# Patient Record
Sex: Male | Born: 1995
Health system: Southern US, Community
[De-identification: ages and names within clinical notes are randomized; demographics above are authoritative.]

---
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
## 2020-02-19 ENCOUNTER — Ambulatory Visit: Admit: 2020-02-19 | Disposition: A | Discharge status: Home / Self Care | Payer: Self-pay

## 2020-02-19 ENCOUNTER — Ambulatory Visit (INDEPENDENT_AMBULATORY_CARE_PROVIDER_SITE_OTHER): Payer: Self-pay

## 2020-02-19 ENCOUNTER — Encounter: Payer: Self-pay | Admitting: Emergency Medicine

## 2020-02-19 ENCOUNTER — Ambulatory Visit
Admission: EM | Admit: 2020-02-19 | Discharge: 2020-02-19 | Disposition: A | Discharge status: Home / Self Care | Payer: Self-pay | Attending: Family Medicine | Admitting: Family Medicine

## 2020-02-19 ENCOUNTER — Other Ambulatory Visit: Payer: Self-pay

## 2020-02-19 DIAGNOSIS — M79644 Pain in right finger(s): Secondary | ICD-10-CM

## 2020-02-19 DIAGNOSIS — S6991XA Unspecified injury of right wrist, hand and finger(s), initial encounter: Secondary | ICD-10-CM

## 2020-02-19 DIAGNOSIS — M7989 Other specified soft tissue disorders: Secondary | ICD-10-CM

## 2020-02-19 MED ORDER — MELOXICAM 15 MG PO TABS: 15.0000 mg | ORAL_TABLET | Freq: Every day | ORAL | 0 refills | Status: DC | PRN

## 2020-02-19 NOTE — ED Triage Notes (Signed)
Patient c/o right middle and ring finger injury while playing basketball 2 weeks ago. He states the pain has continued and his fingers are swollen.

## 2020-02-19 NOTE — ED Provider Notes (Signed)
[facility]    CSN: [id number] Arrival date & time: 02/19/20  1750      History   Chief Complaint Chief Complaint  Patient presents with  . Finger Injury   HPI  24 year old male presents with the above complaint.  Patient reports that he was playing basketball approximately 1.5 weeks ago.  He states that he was catching a pass and jammed his right middle and ring finger.  Patient reports that he continues to have pain despite using a brace.  No medications or other interventions tried.  Patient is concerned given his persistent symptoms and lack of complete resolution.  No other complaints at this time.  Home Medications    Prior to Admission medications   Medication Sig Start Date End Date Taking? Authorizing Provider  meloxicam (MOBIC) 15 MG tablet Take 1 tablet (15 mg total) by mouth daily as needed for pain. 02/19/20   Tommie Sams, DO   Social History Social History   Tobacco Use  . Smoking status: Never Smoker  . Smokeless tobacco: Never Used  Vaping Use  . Vaping Use: Every day  Substance Use Topics  . Alcohol use: Not Currently  . Drug use: Yes    Types: Marijuana     Allergies   Patient has no known allergies.   Review of Systems Review of Systems  Constitutional: Negative.   Musculoskeletal:       Right middle and ring finger pain.   Physical Exam Triage Vital Signs ED Triage Vitals  Enc Vitals Group     BP 02/19/20 1833 (!) 142/91     Pulse Rate 02/19/20 1833 66     Resp 02/19/20 1833 18     Temp 02/19/20 1833 98.1 F (36.7 C)     Temp Source 02/19/20 1833 Oral     SpO2 02/19/20 1833 100 %     Weight 02/19/20 1831 205 lb (93 kg)     Height 02/19/20 1831 5\' 7"  (1.702 m)     Head Circumference --      Peak Flow --      Pain Score 02/19/20 1831 7     Pain Loc --      Pain Edu? --      Excl. in GC? --    Updated Vital Signs BP (!) 142/91 (BP Location: Right Arm)   Pulse 66   Temp 98.1 F (36.7 C) (Oral)   Resp 18   Ht 5'  7" (1.702 m)   Wt 93 kg   SpO2 100%   BMI 32.11 kg/m   Visual Acuity Right Eye Distance:   Left Eye Distance:   Bilateral Distance:    Right Eye Near:   Left Eye Near:    Bilateral Near:     Physical Exam Constitutional:      General: He is not in acute distress.    Appearance: Normal appearance. He is obese. He is not ill-appearing.  HENT:     Head: Normocephalic and atraumatic.  Pulmonary:     Effort: Pulmonary effort is normal. No respiratory distress.  Musculoskeletal:     Comments: Right hand -right middle finger with mild swelling at and below the PIP joint.  No significant tenderness on exam.  Right ring finger with mild swelling at the PIP joint.  No significant tenderness.  Neurological:     Mental Status: He is alert.  Psychiatric:        Mood and Affect: Mood normal.  Behavior: Behavior normal.    UC Treatments / Results  Labs (all labs ordered are listed, but only abnormal results are displayed) Labs Reviewed - No data to display  EKG   Radiology DG Finger Middle Right  Result Date: 02/19/2020 CLINICAL DATA:  Jammed finger playing basketball 2 weeks ago. Persistent pain and swelling of middle and ring finger. EXAM: RIGHT MIDDLE FINGER 2+V COMPARISON:  None. FINDINGS: There is no evidence of fracture or dislocation. There is no evidence of arthropathy or other focal bone abnormality. Mild generalized soft tissue edema. IMPRESSION: Soft tissue edema. No fracture or dislocation of the middle finger. Electronically Signed   By: Narda Rutherford M.D.   On: 02/19/2020 18:53   DG Finger Ring Right  Result Date: 02/19/2020 CLINICAL DATA:  Jammed finger playing basketball 2 weeks ago. Persistent pain and swelling of middle and ring finger. EXAM: RIGHT RING FINGER 2+V COMPARISON:  None. FINDINGS: There is no evidence of fracture or dislocation. There is no evidence of arthropathy or other focal bone abnormality. Soft tissues are unremarkable. IMPRESSION:  Negative radiographs of the ring finger. Electronically Signed   By: Narda Rutherford M.D.   On: 02/19/2020 18:54    Procedures Procedures (including critical care time)  Medications Ordered in UC Medications - No data to display  Initial Impression / Assessment and Plan / UC Course  I have reviewed the triage vital signs and the nursing notes.  Pertinent labs & imaging results that were available during my care of the patient were reviewed by me and considered in my medical decision making (see chart for details).    24 year old male presents with injury to the right middle and right ring finger.  Mild swelling noted on exam.  X-ray obtained and independently reviewed by me.  No apparent fracture of either finger.  Meloxicam as needed for pain and swelling.  Supportive care.  Final Clinical Impressions(s) / UC Diagnoses   Final diagnoses:  Injury of finger of right hand, initial encounter     Discharge Instructions     Medication as needed.  No fracture or dislocation.  Take care  Dr. Adriana Simas    ED Prescriptions    Medication Sig Dispense Auth. Provider   meloxicam (MOBIC) 15 MG tablet Take 1 tablet (15 mg total) by mouth daily as needed for pain. 30 tablet Tommie Sams, DO     PDMP not reviewed this encounter.   Tommie Sams, [place] 02/19/20 2115

## 2020-02-19 NOTE — Discharge Instructions (Signed)
Medication as needed.  No fracture or dislocation.  Take care  Dr. Adriana Simas

## 2020-03-12 ENCOUNTER — Ambulatory Visit
Admission: EM | Admit: 2020-03-12 | Discharge: 2020-03-12 | Disposition: A | Discharge status: Home / Self Care | Payer: HRSA Program | Attending: Family Medicine | Admitting: Family Medicine

## 2020-03-12 ENCOUNTER — Other Ambulatory Visit: Payer: Self-pay

## 2020-03-12 DIAGNOSIS — U071 COVID-19: Secondary | ICD-10-CM

## 2020-03-12 LAB — SARS CORONAVIRUS 2 AG (30 MIN TAT): SARS Coronavirus 2 Ag: POSITIVE — AB

## 2020-03-12 MED ORDER — BENZONATATE 200 MG PO CAPS: 200.0000 mg | ORAL_CAPSULE | Freq: Three times a day (TID) | ORAL | 0 refills | Status: AC | PRN

## 2020-03-12 NOTE — ED Triage Notes (Signed)
Patient has a fever, body aches, extreme fatigue and cough x 3 days.

## 2020-03-12 NOTE — ED Provider Notes (Signed)
[facility]    CSN: [id number] Arrival date & time: 03/12/20  1147      History   Chief Complaint Chief Complaint  Patient presents with  . Fever   HPI  24 year old male presents for evaluation of fever, cough, fatigue.  Patient reports a 3-day history of symptoms.  He reports fever, cough, and fatigue.  He reported body aches to the nursing staff but denies them to me.  Patient currently febrile at 100.9 and is also tachycardic at 121.  Denies pain at this time.  No relieving factors.  No reported sick contacts.  No other reported symptoms.  No other complaints.   Home Medications    Prior to Admission medications   Medication Sig Start Date End Date Taking? Authorizing Provider  benzonatate (TESSALON) 200 MG capsule Take 1 capsule (200 mg total) by mouth 3 (three) times daily as needed for cough. 03/12/20   Tommie Sams, DO   Social History Social History   Tobacco Use  . Smoking status: Never Smoker  . Smokeless tobacco: Never Used  Vaping Use  . Vaping Use: Every day  Substance Use Topics  . Alcohol use: Not Currently  . Drug use: Yes    Types: Marijuana    Allergies   Patient has no known allergies.   Review of Systems Review of Systems  Constitutional: Positive for fatigue and fever.  Respiratory: Positive for cough.    Physical Exam Triage Vital Signs ED Triage Vitals  Enc Vitals Group     BP 03/12/20 1202 (!) 163/92     Pulse Rate 03/12/20 1202 (!) 121     Resp 03/12/20 1202 19     Temp 03/12/20 1202 (!) 100.9 F (38.3 C)     Temp Source 03/12/20 1202 Oral     SpO2 03/12/20 1202 100 %     Weight 03/12/20 1203 200 lb (90.7 kg)     Height 03/12/20 1203 5\' 7"  (1.702 m)     Head Circumference --      Peak Flow --      Pain Score 03/12/20 1202 0     Pain Loc --      Pain Edu? --      Excl. in GC? --    Updated Vital Signs BP (!) 163/92 (BP Location: Left Arm)   Pulse (!) 121   Temp (!) 100.9 F (38.3 C) (Oral)   Resp 19   Ht  5\' 7"  (1.702 m)   Wt 90.7 kg   SpO2 100%   BMI 31.32 kg/m   Visual Acuity Right Eye Distance:   Left Eye Distance:   Bilateral Distance:    Right Eye Near:   Left Eye Near:    Bilateral Near:     Physical Exam Constitutional:      General: He is not in acute distress.    Appearance: Normal appearance. He is not ill-appearing.  HENT:     Head: Normocephalic and atraumatic.  Eyes:     General:        Right eye: No discharge.        Left eye: No discharge.     Conjunctiva/sclera: Conjunctivae normal.  Cardiovascular:     Rate and Rhythm: Regular rhythm. Tachycardia present.  Pulmonary:     Effort: Pulmonary effort is normal.     Breath sounds: Normal breath sounds. No wheezing, rhonchi or rales.  Neurological:     Mental Status: He is alert.  Psychiatric:  Mood and Affect: Mood normal.        Behavior: Behavior normal.    UC Treatments / Results  Labs (all labs ordered are listed, but only abnormal results are displayed) Labs Reviewed  SARS CORONAVIRUS 2 AG (30 MIN TAT) - Abnormal; Notable for the following components:      Result Value   SARS Coronavirus 2 Ag POSITIVE (*)    All other components within normal limits    EKG   Radiology No results found.  Procedures Procedures (including critical care time)  Medications Ordered in UC Medications - No data to display  Initial Impression / Assessment and Plan / UC Course  I have reviewed the triage vital signs and the nursing notes.  Pertinent labs & imaging results that were available during my care of the patient were reviewed by me and considered in my medical decision making (see chart for details).    24 year old male presents with COVID-19.  Rapid test positive today.  This is an acute illness with systemic symptoms.  Tessalon Perles for cough.  Supportive care.  Offered referral to infusion clinic for monoclonal antibody and patient declined.  Final Clinical Impressions(s) / UC Diagnoses    Final diagnoses:  COVID-19     Discharge Instructions     Tylenol 1000 mg 3 times daily as needed for fever.  You can also take motrin 600-800 mg every 8 hours as needed for fever/pain/body aches.  Medication as prescribed for cough.  Stay home.  If you change your mind regarding infusion, please let us know.  Dr,. [facility]    ED Prescriptions    Medication Sig Dispense Auth. Provider   benzonatate (TESSALON) 200 MG capsule Take 1 capsule (200 mg total) by mouth 3 (three) times daily as needed for cough. 30 capsule Tommie Sams, DO     PDMP not reviewed this encounter.   Tommie Sams, [place] 03/12/20 1518

## 2020-03-12 NOTE — Discharge Instructions (Signed)
Tylenol 1000 mg 3 times daily as needed for fever.  You can also take motrin 600-800 mg every 8 hours as needed for fever/pain/body aches.  Medication as prescribed for cough.  Stay home.  If you change your mind regarding infusion, please let us know.  Dr,. [facility]

## 2020-03-13 ENCOUNTER — Telehealth: Payer: Self-pay | Admitting: Nurse Practitioner

## 2020-03-13 DIAGNOSIS — U071 COVID-19: Secondary | ICD-10-CM

## 2020-03-13 NOTE — Telephone Encounter (Signed)
Called to discuss with Sande Brothers about Covid symptoms and the use of regeneron, a monoclonal antibody infusion for those with mild to moderate Covid symptoms and at a high risk of hospitalization.     Pt is qualified for this infusion at the [facility] center due to co-morbid conditions and/or a member of an at-risk group, however declines infusion at this time. Symptoms tier reviewed as well as criteria for ending isolation.  Symptoms reviewed that would warrant ED/Hospital evaluation. Preventative practices reviewed. Patient verbalized understanding. Patient advised to call back if he/she opts to proceed with infusion. Callback number provided. Urgent care and/or ER precautions given for severe symptoms. Last date eligible for infusion: 03/19/20    There are no problems to display for this patient. social vulnerability   Consuello Masse, NP [facility]  [phone] [email or web address]

## 2020-08-05 ENCOUNTER — Other Ambulatory Visit: Payer: Self-pay

## 2020-08-05 ENCOUNTER — Emergency Department
Admission: EM | Admit: 2020-08-05 | Discharge: 2020-08-05 | Disposition: A | Discharge status: Home / Self Care | Payer: Self-pay | Attending: Emergency Medicine | Admitting: Emergency Medicine

## 2020-08-05 DIAGNOSIS — F32A Depression, unspecified: Secondary | ICD-10-CM | POA: Insufficient documentation

## 2020-08-05 DIAGNOSIS — R4589 Other symptoms and signs involving emotional state: Secondary | ICD-10-CM

## 2020-08-05 LAB — CBC
HCT: 42.4 % (ref 39.0–52.0)
Hemoglobin: 13.6 g/dL (ref 13.0–17.0)
MCH: 24.5 pg — ABNORMAL LOW (ref 26.0–34.0)
MCHC: 32.1 g/dL (ref 30.0–36.0)
MCV: 76.5 fL — ABNORMAL LOW (ref 80.0–100.0)
Platelets: 295 10*3/uL (ref 150–400)
RBC: 5.54 MIL/uL (ref 4.22–5.81)
RDW: 13.6 % (ref 11.5–15.5)
WBC: 7.4 10*3/uL (ref 4.0–10.5)
nRBC: 0 % (ref 0.0–0.2)

## 2020-08-05 LAB — COMPREHENSIVE METABOLIC PANEL
ALT: 18 U/L (ref 0–44)
AST: 19 U/L (ref 15–41)
Albumin: 4.3 g/dL (ref 3.5–5.0)
Alkaline Phosphatase: 67 U/L (ref 38–126)
Anion gap: 11 (ref 5–15)
BUN: 14 mg/dL (ref 6–20)
CO2: 24 mmol/L (ref 22–32)
Calcium: 9.2 mg/dL (ref 8.9–10.3)
Chloride: 105 mmol/L (ref 98–111)
Creatinine, Ser: 0.85 mg/dL (ref 0.61–1.24)
GFR, Estimated: >60 mL/min (ref >60)
Glucose, Bld: 106 mg/dL — ABNORMAL HIGH (ref 70–99)
Potassium: 4 mmol/L (ref 3.5–5.1)
Sodium: 140 mmol/L (ref 135–145)
Total Bilirubin: 0.4 mg/dL (ref 0.3–1.2)
Total Protein: 7.9 g/dL (ref 6.5–8.1)

## 2020-08-05 LAB — ETHANOL: Alcohol, Ethyl (B): <10 mg/dL (ref <10)

## 2020-08-05 LAB — ACETAMINOPHEN LEVEL: Acetaminophen (Tylenol), Serum: <10 ug/mL — ABNORMAL LOW (ref 10–30)

## 2020-08-05 LAB — SALICYLATE LEVEL: Salicylate Lvl: <7 mg/dL — ABNORMAL LOW (ref 7.0–30.0)

## 2020-08-05 MED ORDER — ESCITALOPRAM OXALATE 10 MG PO TABS: 10.0000 mg | ORAL_TABLET | Freq: Every day | ORAL | 0 refills | Status: AC

## 2020-08-05 NOTE — Discharge Instructions (Signed)
If symptoms are not improved after 1 week, increase dose to 2 tablets (20mg ) daily.  You should make an appointment to follow up with a mental health provider in 1-2 weeks for reassessment of your symptoms.  Return to the ER right away if you have thoughts of harming yourself or other, hallucinations, or concerns about your safety.

## 2020-08-05 NOTE — ED Provider Notes (Signed)
[facility] Emergency Department Provider Note  ____________________________________________  Time seen: Approximately 7:17 PM  I have reviewed the triage vital signs and the nursing notes.   HISTORY  Chief Complaint Suicidal    HPI Phillip Berry is a 25 y.o. male with a past history of depression, used to be on Lexapro but ran out of that several months ago due to a change in employment and loss of insurance.  Lately, he is feeling stressed and overwhelmed.  Today he was worried that he was getting more depressed again so he called [facility] for assistance, but they were unable to help him.  He comes to the ED because he could not find any outpatient resources.  Contrary to the triage note, he completely denies any SI or intent to harm himself.  He feels safe at home.  No HI or hallucinations.  No history of self-harm or psychiatric hospitalizations.  No significant alcohol or drug use.   Feeling normal sleep, normal appetite, normal volition.  Does not feel hopeless or excessive guilt.  Feels motivated and future oriented.     History reviewed. No pertinent past medical history.   There are no problems to display for this patient.    History reviewed. No pertinent surgical history.   Prior to Admission medications   Medication Sig Start Date End Date Taking? Authorizing Provider  escitalopram (LEXAPRO) 10 MG tablet Take 1 tablet (10 mg total) by mouth daily. 08/05/20 11/03/20 Yes Sharman Cheek, MD  benzonatate (TESSALON) 200 MG capsule Take 1 capsule (200 mg total) by mouth 3 (three) times daily as needed for cough. 03/12/20   Tommie Sams, DO     Allergies Patient has no known allergies.   No family history on file.  Social History Social History   Tobacco Use  . Smoking status: Never Smoker  . Smokeless tobacco: Never Used  Vaping Use  . Vaping Use: Every day  Substance Use Topics  . Alcohol use: Not Currently  . Drug  use: Yes    Types: Marijuana    Review of Systems  Constitutional:   No fever or chills.  ENT:   No sore throat. No rhinorrhea. Cardiovascular:   No chest pain or syncope. Respiratory:   No dyspnea or cough. Gastrointestinal:   Negative for abdominal pain, vomiting and diarrhea.  Musculoskeletal:   Negative for focal pain or swelling All other systems reviewed and are negative except as documented above in ROS and HPI.  ____________________________________________   PHYSICAL EXAM:  VITAL SIGNS: ED Triage Vitals [08/05/20 1725]  Enc Vitals Group     BP (!) 162/79     Pulse Rate 91     Resp 20     Temp 98 F (36.7 C)     Temp Source Oral     SpO2 98 %     Weight 210 lb (95.3 kg)     Height 5\' 7"  (1.702 m)     Head Circumference      Peak Flow      Pain Score 0     Pain Loc      Pain Edu?      Excl. in GC?     Vital signs reviewed, nursing assessments reviewed.   Constitutional:   Alert and oriented. Non-toxic appearance. Eyes:   Conjunctivae are normal. EOMI. ENT      Head:   Normocephalic and atraumatic.      Mouth/Throat:   MMM  Neck:   No meningismus. Full ROM.  Cardiovascular:   RRR. Symmetric bilateral radial and DP pulses.  Cap refill less than 2 seconds. Respiratory:   Normal respiratory effort without tachypnea/retractions. Gastrointestinal:   Soft and nontender. Non distended. There is no CVA tenderness.  No rebound, rigidity, or guarding. Musculoskeletal:   Normal range of motion in all extremities.  No edema. Neurologic:   Normal speech and language.  Motor grossly intact. No acute focal neurologic deficits are appreciated.  Skin:    Skin is warm, dry and intact. No rash noted.  No wounds.  ____________________________________________    LABS (pertinent positives/negatives) (all labs ordered are listed, but only abnormal results are displayed) Labs Reviewed  COMPREHENSIVE METABOLIC PANEL - Abnormal; Notable for the following components:       Result Value   Glucose, Bld 106 (*)    All other components within normal limits  SALICYLATE LEVEL - Abnormal; Notable for the following components:   Salicylate Lvl <7.0 (*)    All other components within normal limits  ACETAMINOPHEN LEVEL - Abnormal; Notable for the following components:   Acetaminophen (Tylenol), Serum <10 (*)    All other components within normal limits  CBC - Abnormal; Notable for the following components:   MCV 76.5 (*)    MCH 24.5 (*)    All other components within normal limits  ETHANOL  URINE DRUG SCREEN, QUALITATIVE ([facility] ONLY)   ____________________________________________   EKG  ____________________________________________    RADIOLOGY  No results found.  ____________________________________________   PROCEDURES Procedures  ____________________________________________  CLINICAL IMPRESSION / ASSESSMENT AND PLAN / ED COURSE  Pertinent labs & imaging results that were available during my care of the patient were reviewed by me and considered in my medical decision making (see chart for details).  Phillip Berry was evaluated in Emergency Department on 08/05/2020 for the symptoms described in the history of present illness. He was evaluated in the context of the global COVID-19 pandemic, which necessitated consideration that the patient might be at risk for infection with the SARS-CoV-2 virus that causes COVID-19. Institutional protocols and algorithms that pertain to the evaluation of patients at risk for COVID-19 are in a state of rapid change based on information released by regulatory bodies including the CDC and federal and state organizations. These policies and algorithms were followed during the patient's care in the ED.   Patient presents with very mild symptoms of depression.  Had good results with Lexapro in the past.  No safety issues currently, seems reliable.  Will give follow-up information, prescription for Lexapro.  Stable for  discharge.  Very low risk for serious self-harm.  Return precautions given.      ____________________________________________   FINAL CLINICAL IMPRESSION(S) / ED DIAGNOSES    Final diagnoses:  Depressed mood     ED Discharge Orders         Ordered    escitalopram (LEXAPRO) 10 MG tablet  Daily        08/05/20 1916          Portions of this note were generated with dragon dictation software. Dictation errors may occur despite best attempts at proofreading.   Sharman Cheek, MD 08/05/20 [phone]

## 2020-08-05 NOTE — ED Notes (Addendum)
Pt dressed out with this RN and [place] NT. Belongings include: [employer] Name tag Black jacket Black graphic tee White socks Cell phone [employer] boxers vape

## 2020-08-05 NOTE — ED Triage Notes (Signed)
Pt to ED POV for chief complaint of SI.  Pt states has hx of depression but really felt like he was going to kill himself last night and came here because he states he "really doesn't want to do that".  Pt tearful in triage Denies plan

## 2020-08-05 NOTE — ED Notes (Signed)
VOL, pend TTS consult °

## 2020-12-25 ENCOUNTER — Encounter: Payer: Self-pay | Admitting: Physician Assistant

## 2020-12-25 ENCOUNTER — Ambulatory Visit: Payer: Self-pay | Admitting: Physician Assistant

## 2020-12-25 ENCOUNTER — Other Ambulatory Visit: Payer: Self-pay

## 2020-12-25 DIAGNOSIS — Z113 Encounter for screening for infections with a predominantly sexual mode of transmission: Secondary | ICD-10-CM

## 2020-12-25 DIAGNOSIS — A5401 Gonococcal cystitis and urethritis, unspecified: Secondary | ICD-10-CM

## 2020-12-25 DIAGNOSIS — F32A Depression, unspecified: Secondary | ICD-10-CM | POA: Insufficient documentation

## 2020-12-25 LAB — GRAM STAIN

## 2020-12-25 MED ORDER — DOXYCYCLINE HYCLATE 100 MG PO TABS: 100.0000 mg | ORAL_TABLET | Freq: Two times a day (BID) | ORAL | 0 refills | Status: AC

## 2020-12-25 MED ORDER — CEFTRIAXONE SODIUM 500 MG IJ SOLR
500.0000 mg | Freq: Once | INTRAMUSCULAR | Status: AC
  Administered 2020-12-25: 16:00:00 500 mg via INTRAMUSCULAR

## 2020-12-25 NOTE — Progress Notes (Signed)
[facility] Department STI clinic/screening visit  Subjective:  Phillip Berry is a 25 y.o. male being seen today for an STI screening visit. The patient reports they do have symptoms.    Patient has the following medical conditions:   Patient Active Problem List   Diagnosis Date Noted   Depression 12/25/2020     Chief Complaint  Patient presents with   SEXUALLY TRANSMITTED DISEASE    screening    HPI  Patient reports that he has had dysuria and cloudy discharge since 12/21/2020.  Denies other symptoms, surgeries and regular medicines.  States that he was in counseling for depression and would like to return to counseling.  Reports last HIV test was about 2 years ago and last void prior to sample collection for Gram stain was about 2 hr ago.    See flowsheet for further details and programmatic requirements.    The following portions of the patient's history were reviewed and updated as appropriate: allergies, current medications, past medical history, past social history, past surgical history and problem list.  Objective:  There were no vitals filed for this visit.  Physical Exam Constitutional:      General: He is not in acute distress.    Appearance: Normal appearance.  HENT:     Head: Normocephalic and atraumatic.     Comments: No nits,lice, or hair loss. No cervical, supraclavicular or axillary adenopathy.     Mouth/Throat:     Mouth: Mucous membranes are moist.     Pharynx: Oropharynx is clear. No oropharyngeal exudate or posterior oropharyngeal erythema.  Eyes:     Conjunctiva/sclera: Conjunctivae normal.  Pulmonary:     Effort: Pulmonary effort is normal.  Abdominal:     Palpations: Abdomen is soft. There is no mass.     Tenderness: There is no abdominal tenderness. There is no guarding or rebound.  Genitourinary:    Penis: Normal.      Testes: Normal.     Comments: Pubic area without nits, lice, hair loss, edema, erythema, lesions and  inguinal adenopathy. Penis circumcised without rash or lesions. Small amount of cloudy, grayish discharge at meatus. Testicles descended bilaterally,nt, no masses or edema.  Musculoskeletal:     Cervical back: Neck supple. No tenderness.  Skin:    General: Skin is warm and dry.     Findings: No bruising, erythema, lesion or rash.  Neurological:     Mental Status: He is alert and oriented to person, place, and time.  Psychiatric:        Mood and Affect: Mood normal.        Behavior: Behavior normal.        Thought Content: Thought content normal.        Judgment: Judgment normal.      Assessment and Plan:  Phillip Berry is a 25 y.o. male presenting to the [facility] Department for STI screening  1. Screening for STD (sexually transmitted disease) Patient into clinic with symptoms. Patient declines blood work today. Reviewed with patient Gram stain results and that treatment is needed today.  Rec condoms with all sex. Await test results.  Counseled that RN will call if needs to RTC for treatment once results are back.  - Gram stain  2. Depression, unspecified depression type Patient reports history of depression and requests referral to LCSW to resume counseling. LCSW and [employer] cards given to patient today.  - Ambulatory referral to Behavioral Health  3. Gonococcal urethritis in  male Will treat for GC and cover for Chlamydia with Ceftriaxone 500 mg IM and Doxycycline 100 mg #14 1 po BID for 7 days. No sex for 14 days and until after partner/s complete treatment. Call with questions or concerns. - cefTRIAXone (ROCEPHIN) injection 500 mg - doxycycline (VIBRA-TABS) 100 MG tablet; Take 1 tablet (100 mg total) by mouth 2 (two) times daily for 7 days.  Dispense: 14 tablet; Refill: 0     No follow-ups on file.  No future appointments.  Matt Holmes, PA

## 2020-12-26 NOTE — Progress Notes (Signed)
Chart reviewed by Pharmacist  Suzanne Walker PharmD, Contract Pharmacist at [facility]  

## 2021-12-05 IMAGING — CR DG FINGER MIDDLE 2+V*R*
3 series · 3 of 3 positions shown · non-contrast
Comparison: None.

CLINICAL DATA: Jammed finger playing basketball 2 weeks ago.
Persistent pain and swelling of middle and ring finger.

EXAM:
RIGHT MIDDLE FINGER 2+V

[finger ap]
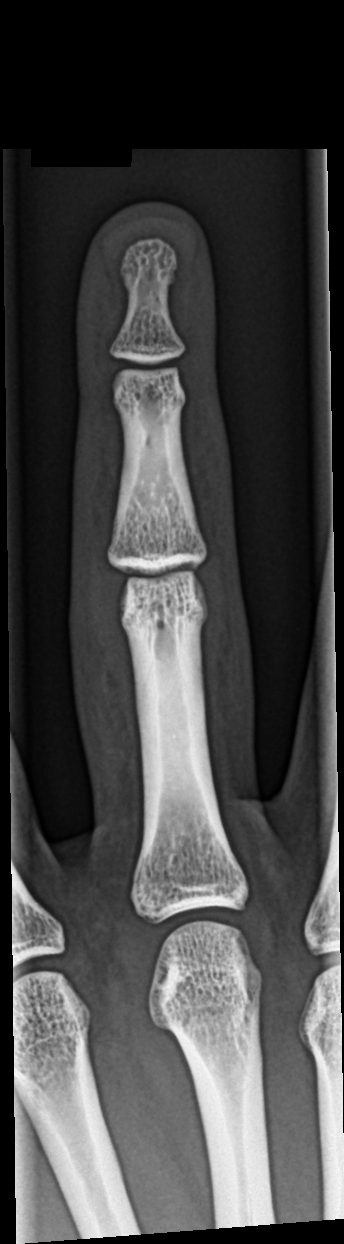

[finger obl]
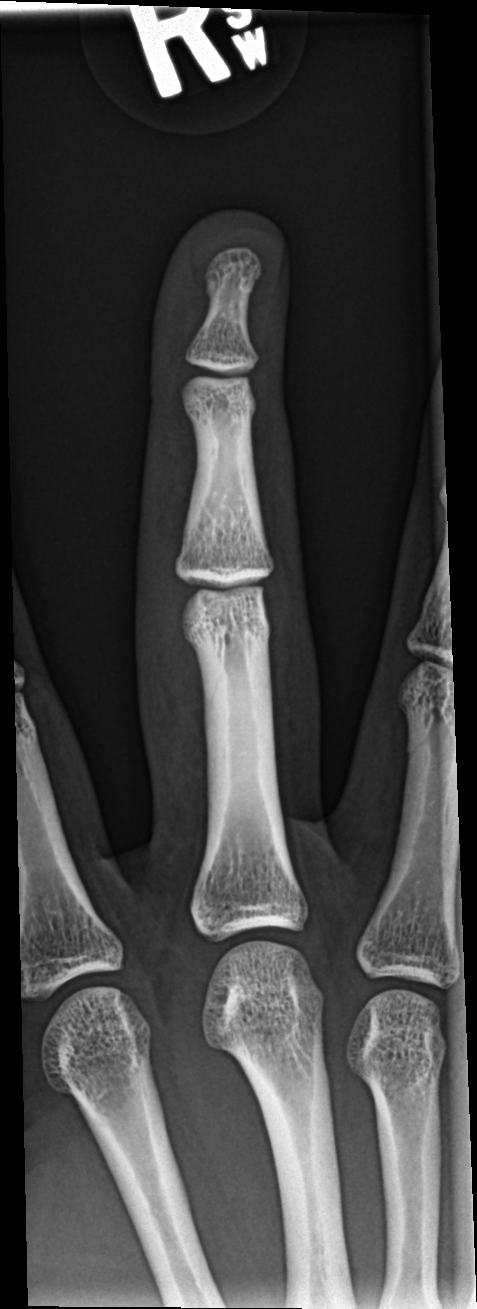

[finger lat]
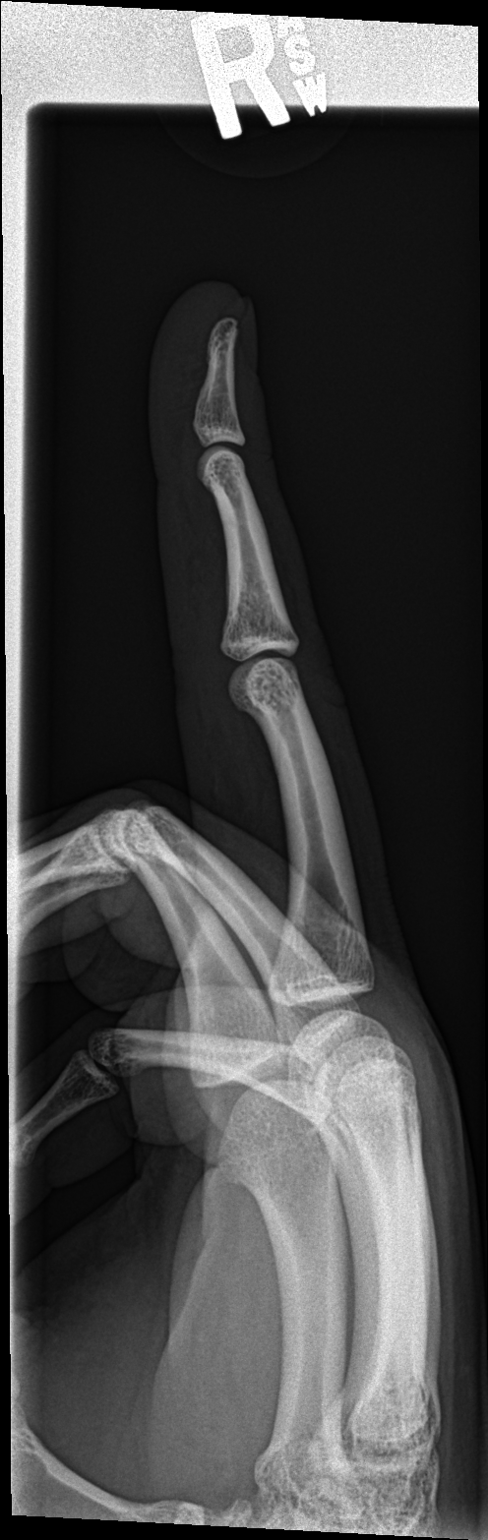

[3 of 3 positions shown; findings below may reference images not displayed]

FINDINGS: There is no evidence of fracture or dislocation. There is no
evidence of arthropathy or other focal bone abnormality. Mild
generalized soft tissue edema.
IMPRESSION: Soft tissue edema. No fracture or dislocation of the middle finger.

## 2021-12-05 IMAGING — CR DG FINGER RING 2+V*R*
3 series · 3 of 3 positions shown · non-contrast
Comparison: None.

CLINICAL DATA: Jammed finger playing basketball 2 weeks ago.
Persistent pain and swelling of middle and ring finger.

EXAM:
RIGHT RING FINGER 2+V

[finger ap]
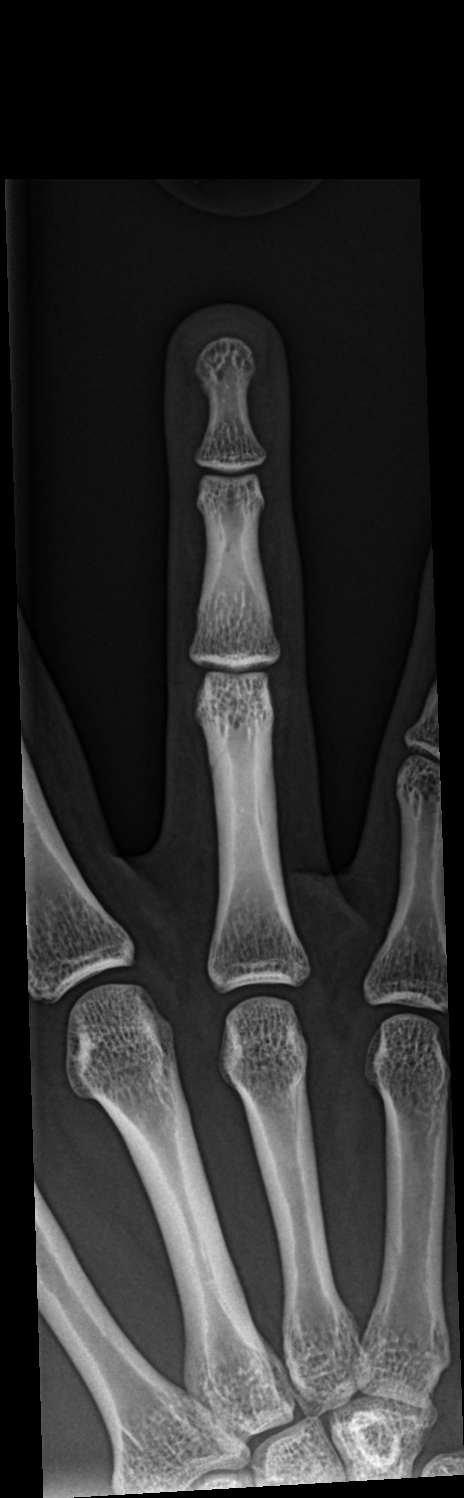

[finger obl]
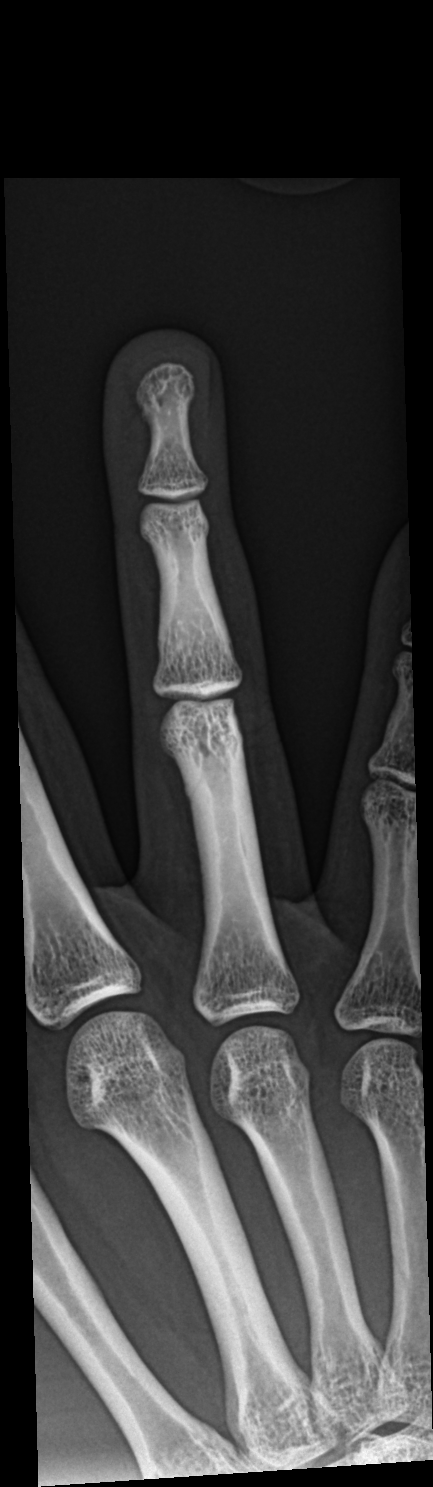

[finger lat]
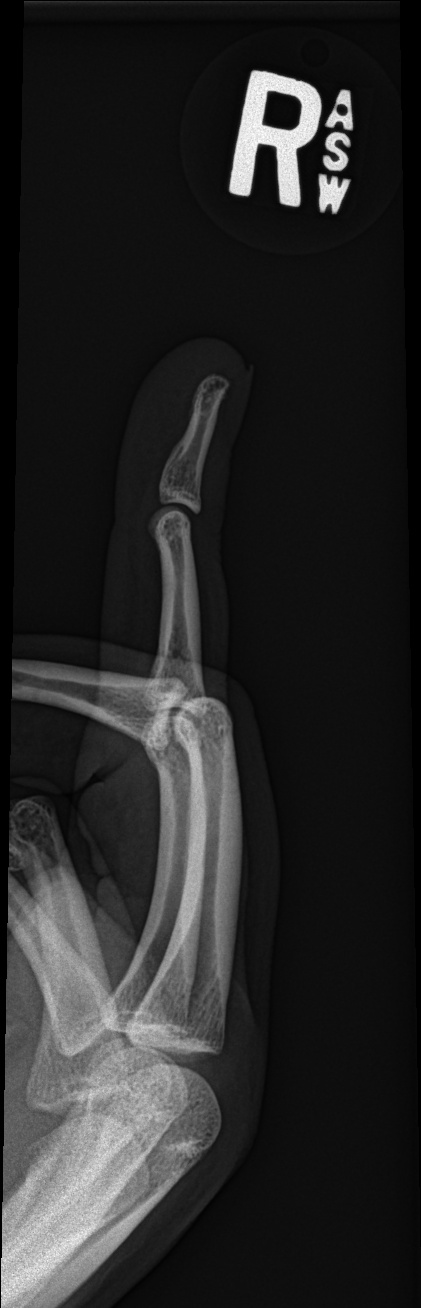

[3 of 3 positions shown; findings below may reference images not displayed]

FINDINGS: There is no evidence of fracture or dislocation. There is no
evidence of arthropathy or other focal bone abnormality. Soft
tissues are unremarkable.
IMPRESSION: Negative radiographs of the ring finger.

## 2022-10-12 ENCOUNTER — Other Ambulatory Visit: Payer: Self-pay | Admitting: Chiropractor

## 2022-10-12 ENCOUNTER — Ambulatory Visit
Admission: RE | Admit: 2022-10-12 | Discharge: 2022-10-12 | Disposition: A | Discharge status: Home / Self Care | Payer: Self-pay | Source: Ambulatory Visit | Attending: Chiropractor | Admitting: Chiropractor

## 2022-10-12 ENCOUNTER — Ambulatory Visit
Admission: RE | Admit: 2022-10-12 | Discharge: 2022-10-12 | Disposition: A | Discharge status: Home / Self Care | Payer: Self-pay | Attending: Chiropractor | Admitting: Chiropractor

## 2022-10-12 DIAGNOSIS — S134XXA Sprain of ligaments of cervical spine, initial encounter: Secondary | ICD-10-CM

## 2022-10-12 DIAGNOSIS — S338XXA Sprain of other parts of lumbar spine and pelvis, initial encounter: Secondary | ICD-10-CM

## 2022-10-12 DIAGNOSIS — S233XXA Sprain of ligaments of thoracic spine, initial encounter: Secondary | ICD-10-CM
